# Patient Record
Sex: Male | Born: 1995 | Race: White | Hispanic: No | Marital: Single | Smoking: Never smoker
Health system: Southern US, Community
[De-identification: ages and names within clinical notes are randomized; demographics above are authoritative.]

## PROBLEM LIST (undated history)

## (undated) DIAGNOSIS — M6282 Rhabdomyolysis: Secondary | ICD-10-CM

## (undated) DIAGNOSIS — R51 Headache: Secondary | ICD-10-CM

## (undated) DIAGNOSIS — G253 Myoclonus: Secondary | ICD-10-CM

## (undated) DIAGNOSIS — R42 Dizziness and giddiness: Secondary | ICD-10-CM

## (undated) HISTORY — DX: Myoclonus: G25.3

## (undated) HISTORY — DX: Rhabdomyolysis: M62.82

## (undated) HISTORY — DX: Headache: R51

## (undated) HISTORY — DX: Dizziness and giddiness: R42

---
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
## 2015-04-22 IMAGING — CR DG CHEST 2V
2 series · 2 of 2 positions shown · non-contrast
Comparison: None.

CLINICAL DATA: Malaise. Fatigue. Fever. Cough. Dizziness. Night
sweats.

EXAM:
CHEST  2 VIEW

[w chest pa]
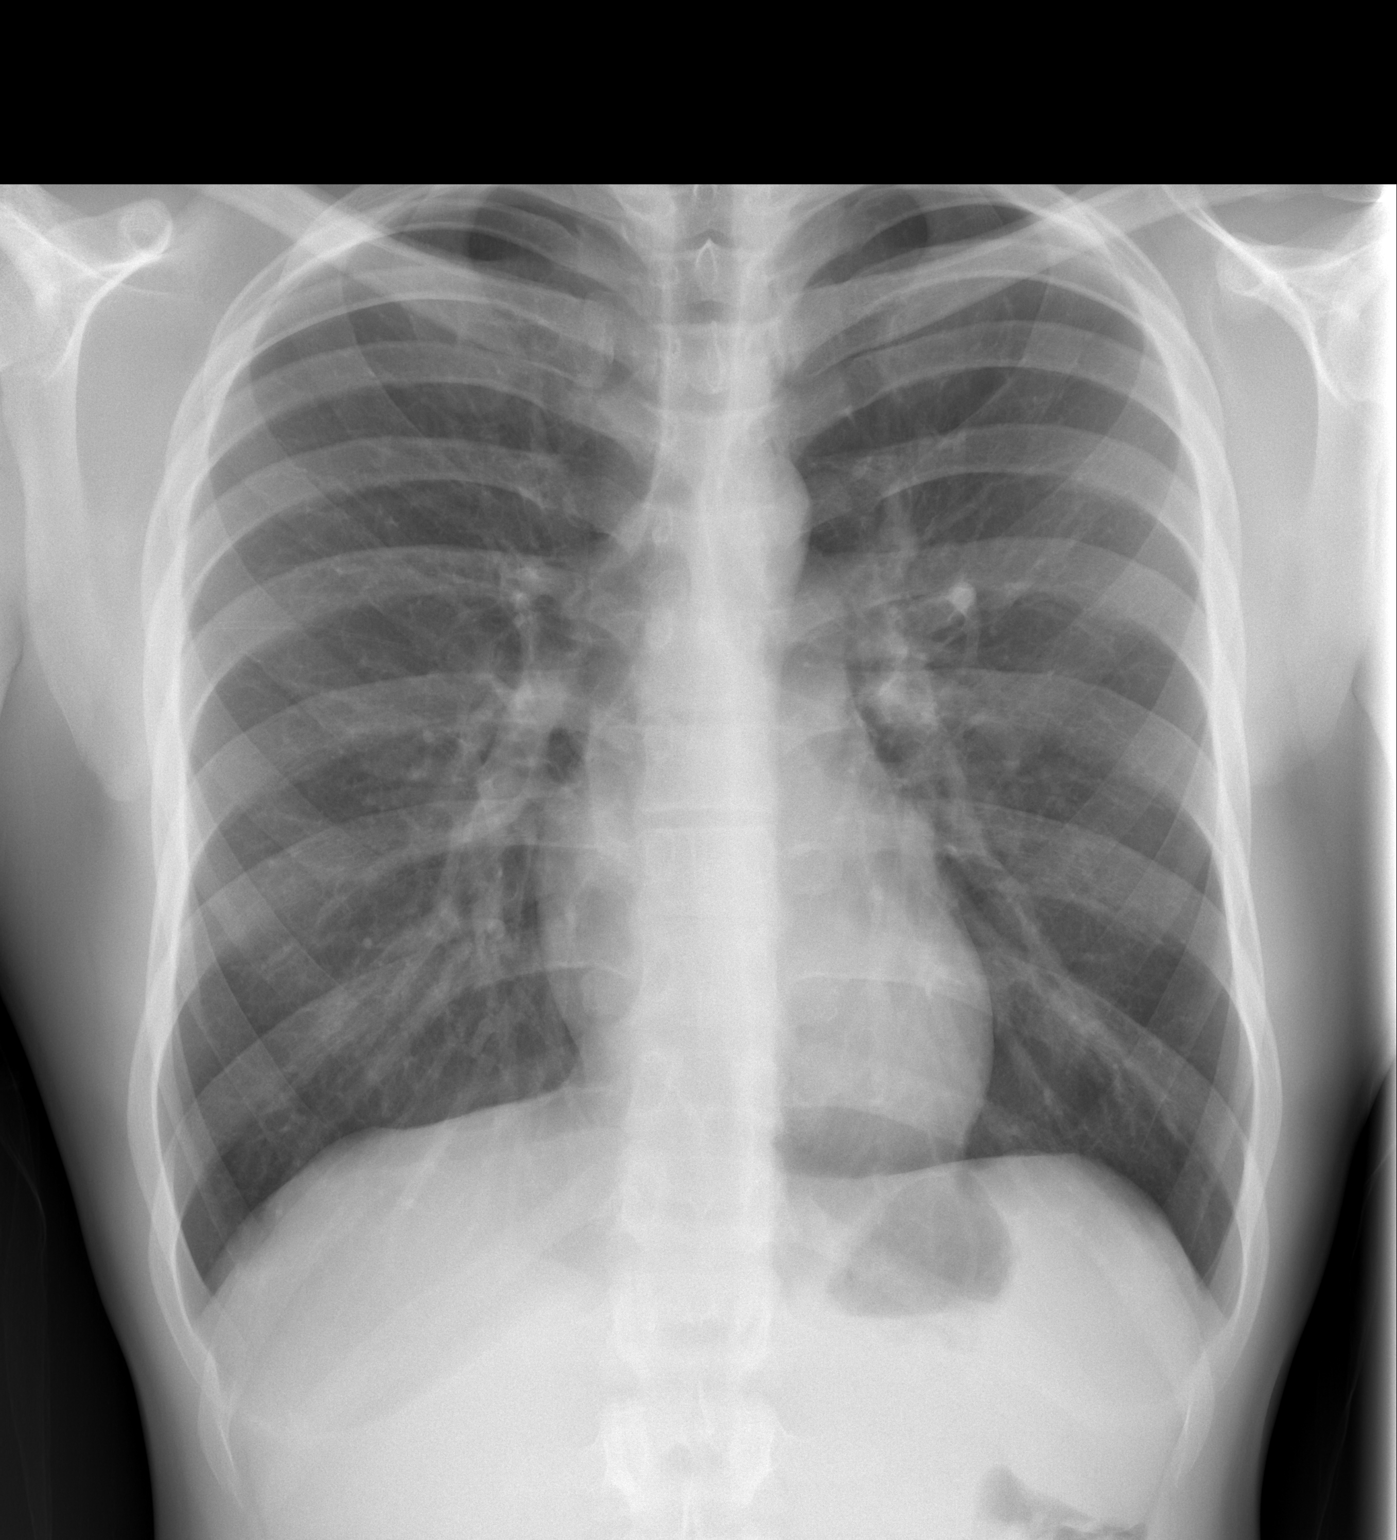

[w chest lat]
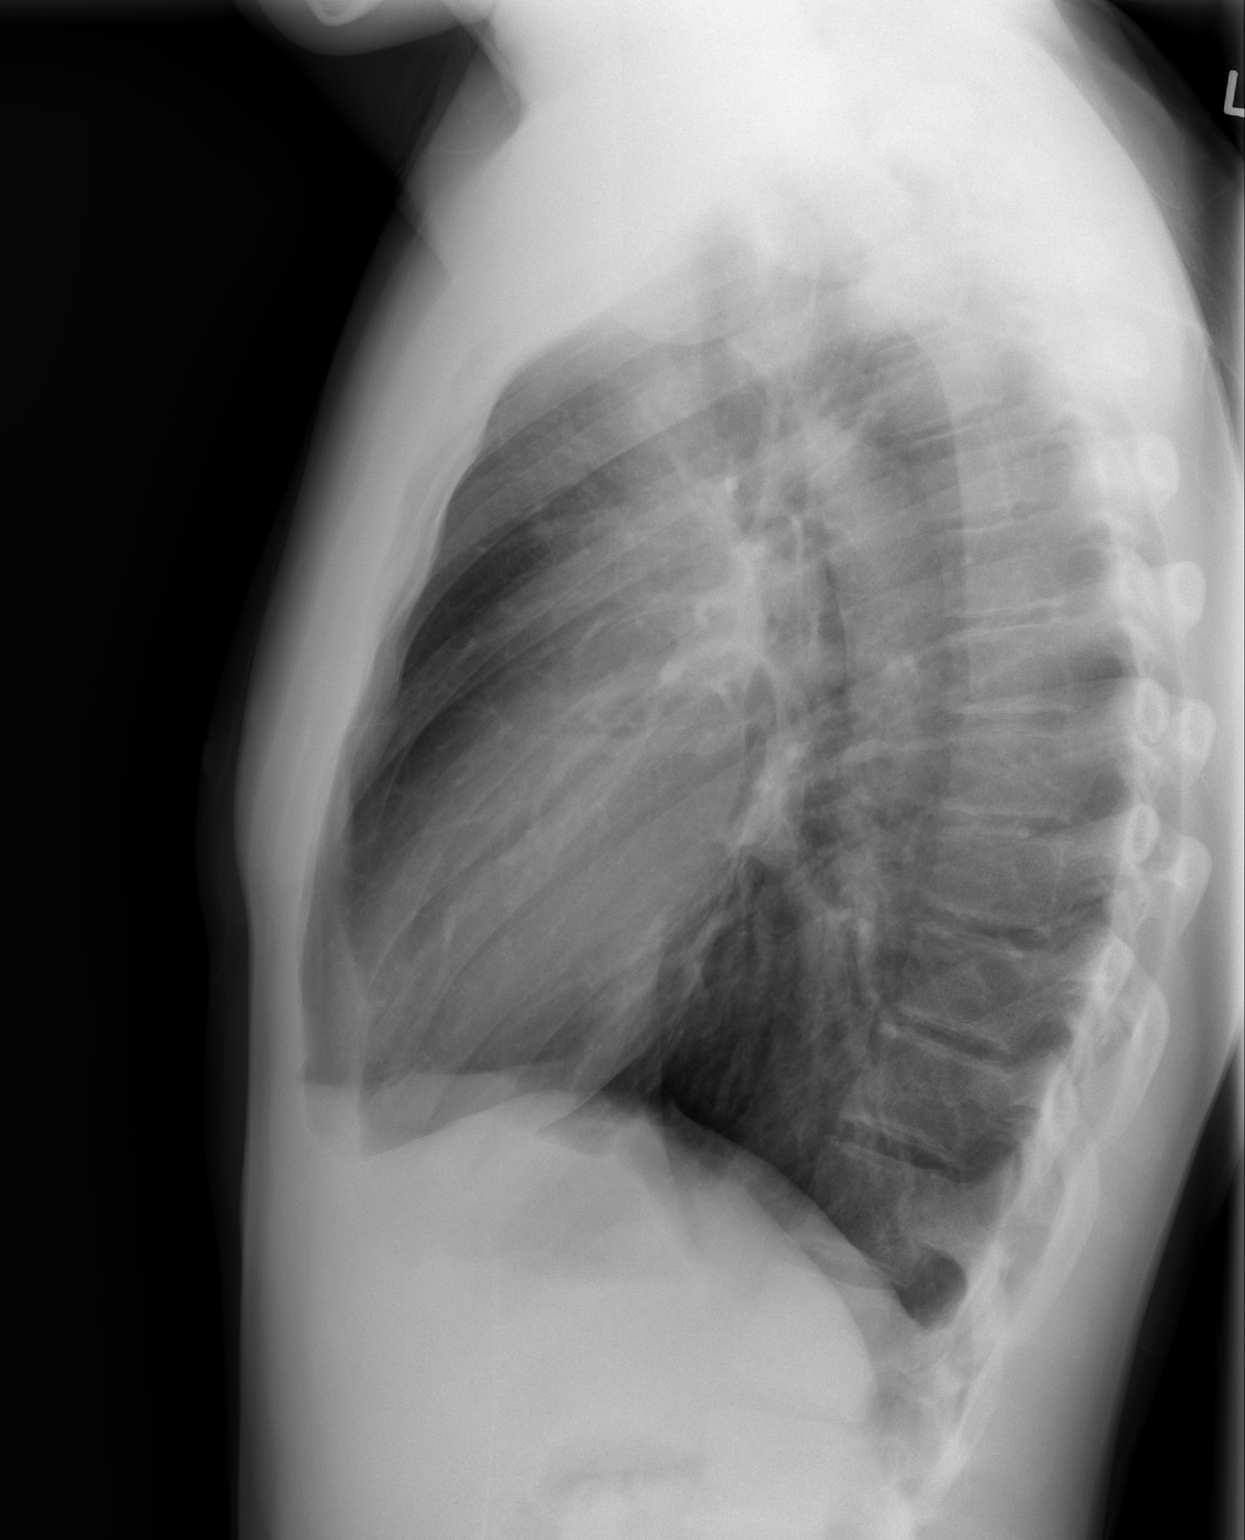

[2 of 2 positions shown; findings below may reference images not displayed]

FINDINGS: The heart size and mediastinal contours are within normal limits.
Both lungs are clear. No mass or lymphadenopathy identified. The
visualized skeletal structures are unremarkable.
IMPRESSION: No active cardiopulmonary disease.

## 2016-03-04 IMAGING — CR DG CHEST 2V
2 series · 2 of 2 positions shown · non-contrast
Comparison: 07/26/2013

CLINICAL DATA: Mid chest pain for 2 days, atypical chest pain,
nonsmoker

EXAM:
CHEST  2 VIEW

[w chest pa *]
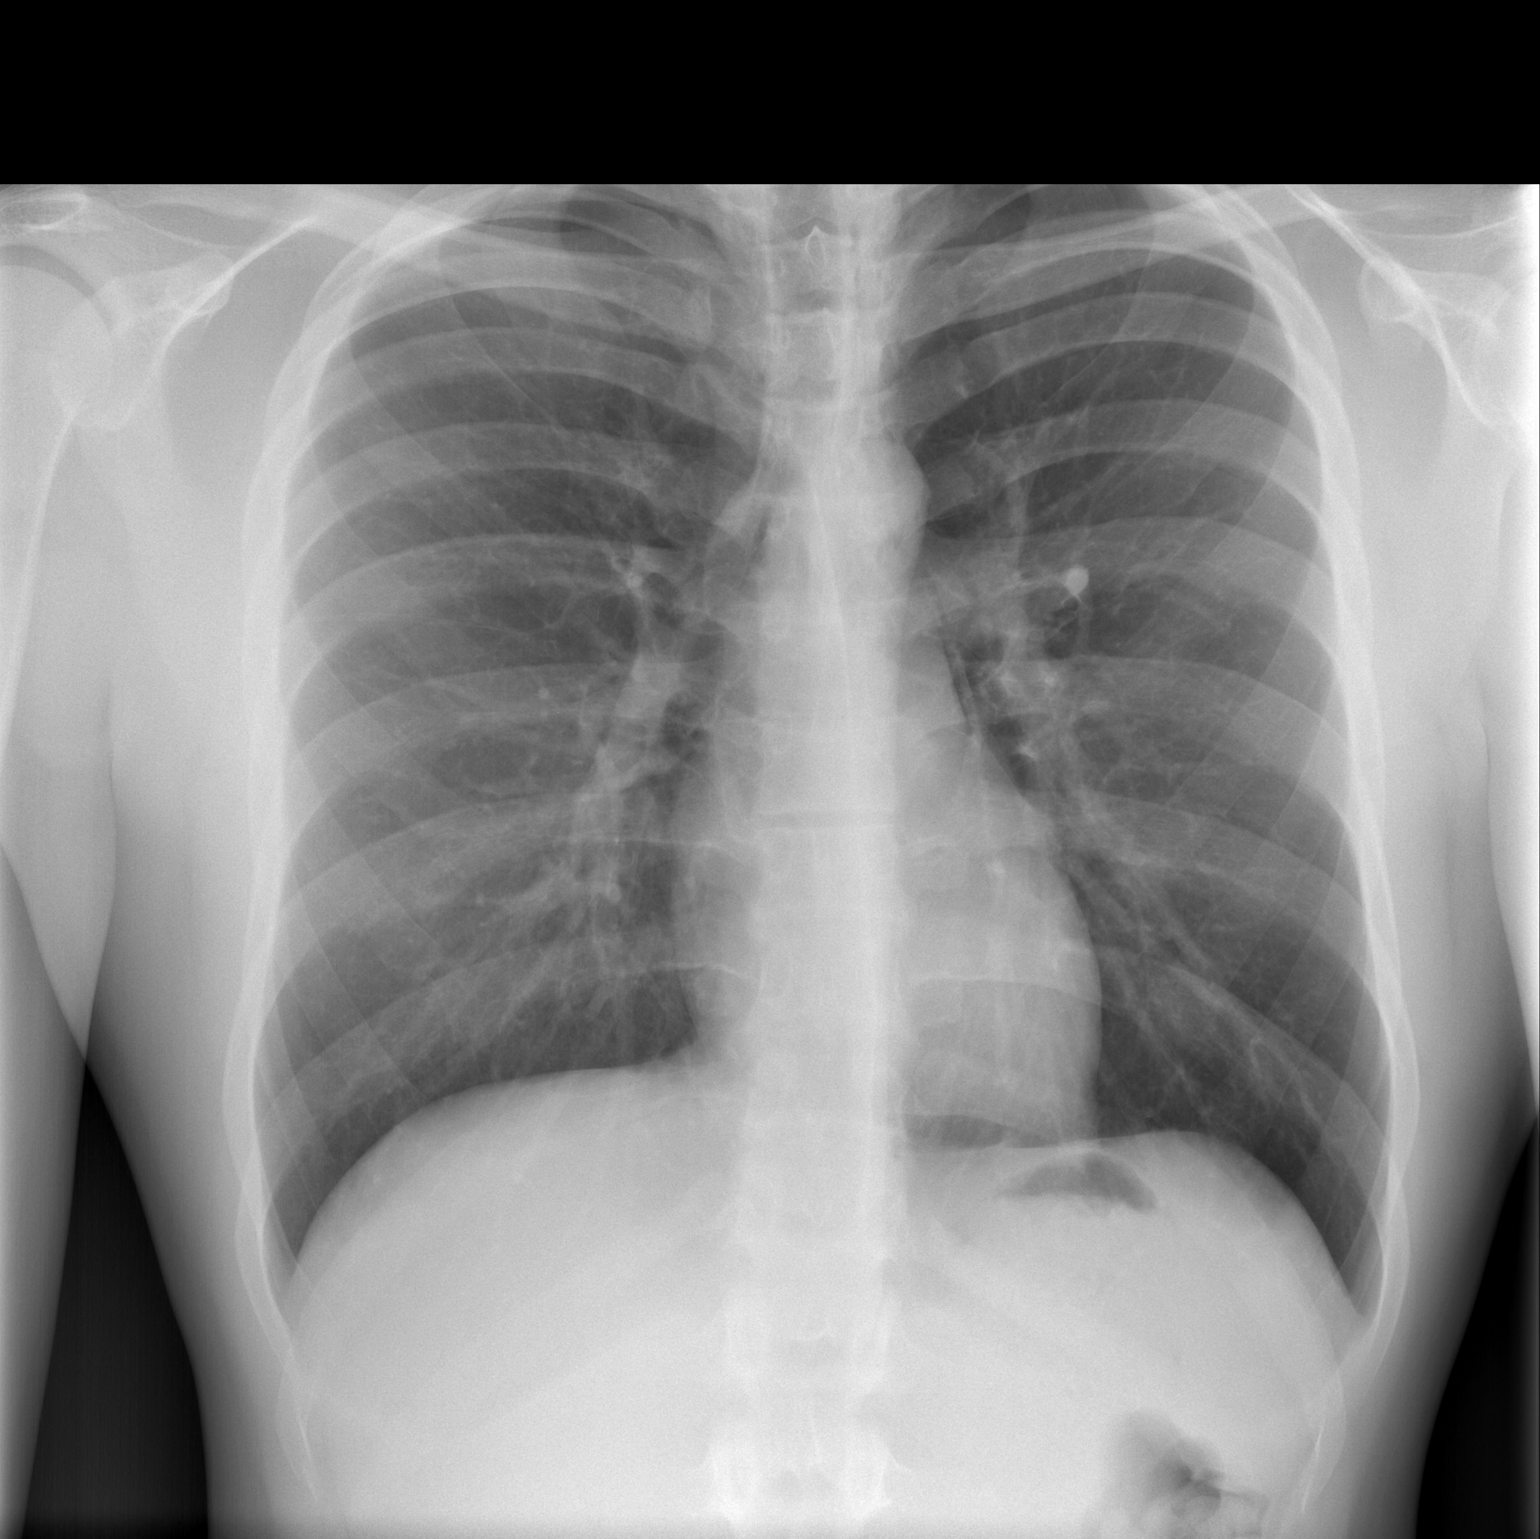

[w chest lat]
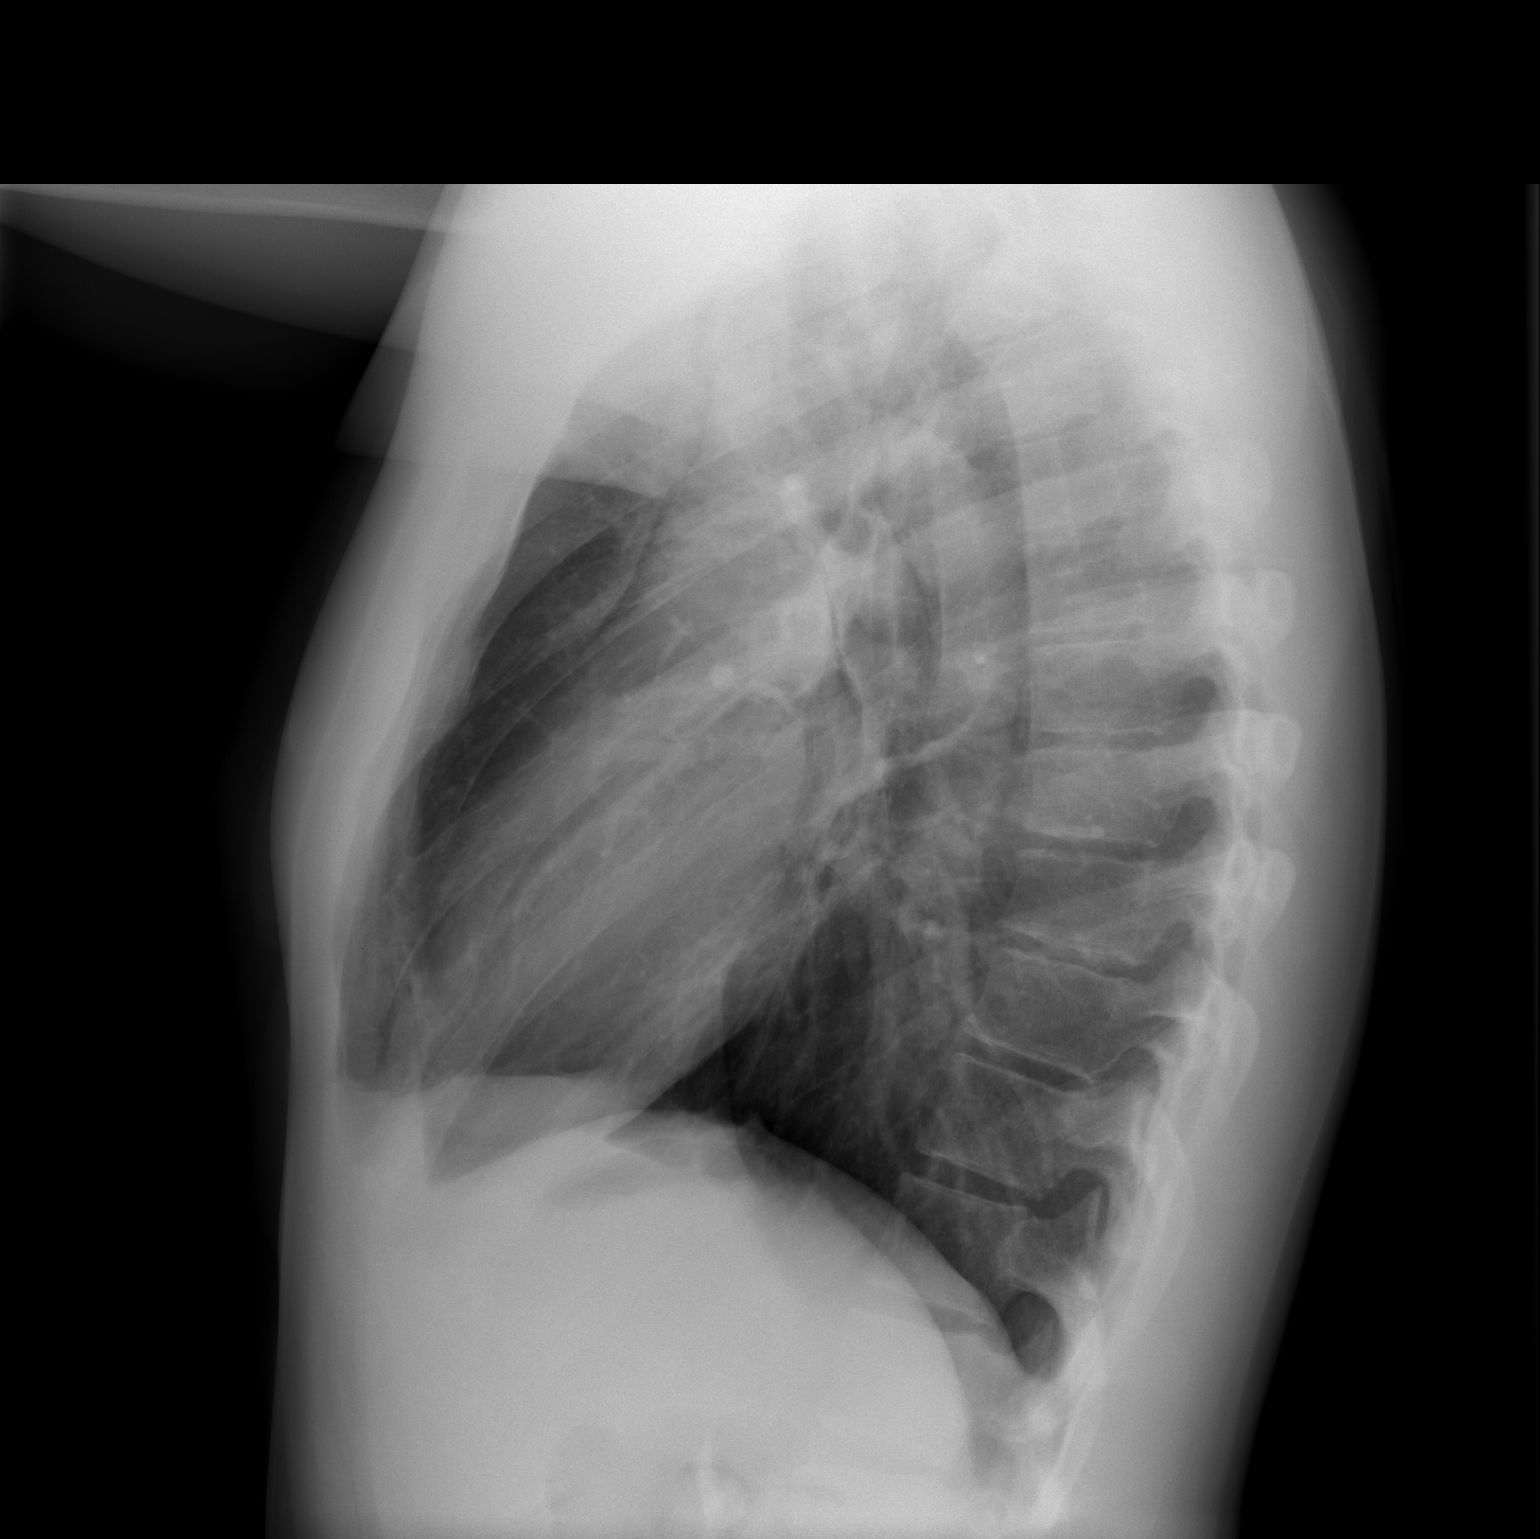

[2 of 2 positions shown; findings below may reference images not displayed]

FINDINGS: Cardiomediastinal silhouette is unremarkable. No acute infiltrate or
pleural effusion. No pulmonary edema. Bony thorax is unremarkable.
IMPRESSION: No active cardiopulmonary disease.

## 2018-02-15 DIAGNOSIS — H5213 Myopia, bilateral: Secondary | ICD-10-CM | POA: Diagnosis not present

## 2018-02-15 DIAGNOSIS — H52221 Regular astigmatism, right eye: Secondary | ICD-10-CM | POA: Diagnosis not present
